# Patient Record
Sex: Female | Born: 2000 | Race: White | Hispanic: No | Marital: Single | Smoking: Never smoker
Health system: Southern US, Community
[De-identification: ages and names within clinical notes are randomized; demographics above are authoritative.]

## PROBLEM LIST (undated history)

## (undated) HISTORY — PX: NO PAST SURGERIES: SHX2092

---
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
## 2021-01-01 ENCOUNTER — Ambulatory Visit: Payer: BC Managed Care – PPO | Admitting: Obstetrics and Gynecology

## 2021-01-05 ENCOUNTER — Other Ambulatory Visit: Payer: Self-pay | Admitting: Obstetrics and Gynecology

## 2021-01-05 NOTE — Telephone Encounter (Signed)
Medication refill request: OCP  Last AEX:  01-14-20 BS Next AEX: 02-05-21 Last MMG (if hormonal medication request): n/a Refill authorized: Today, please advise.   Medication pended for #28, 0RF. Please refill if appropriate.

## 2021-01-13 ENCOUNTER — Other Ambulatory Visit: Payer: Self-pay

## 2021-01-13 MED ORDER — JUNEL FE 24 1-20 MG-MCG(24) PO TABS: 1.0000 | ORAL_TABLET | Freq: Every day | ORAL | 1 refills | Status: DC

## 2021-01-13 NOTE — Telephone Encounter (Signed)
Patient needs a refill for her BC enough until she comes for her annual in august with TW.  (Dr. Edward Jolly did not have available appt prior to patient returning to school.)  Scheduled 03/11/21

## 2021-02-05 ENCOUNTER — Ambulatory Visit: Payer: BC Managed Care – PPO | Admitting: Obstetrics and Gynecology

## 2021-03-11 ENCOUNTER — Encounter: Payer: Self-pay | Admitting: Nurse Practitioner

## 2021-03-11 ENCOUNTER — Ambulatory Visit (INDEPENDENT_AMBULATORY_CARE_PROVIDER_SITE_OTHER): Payer: BC Managed Care – PPO | Admitting: Nurse Practitioner

## 2021-03-11 ENCOUNTER — Other Ambulatory Visit: Payer: Self-pay

## 2021-03-11 VITALS — BP 114/70 | Ht 65.0 in | Wt 133.0 lb

## 2021-03-11 DIAGNOSIS — Z3041 Encounter for surveillance of contraceptive pills: Secondary | ICD-10-CM | POA: Diagnosis not present

## 2021-03-11 DIAGNOSIS — Z113 Encounter for screening for infections with a predominantly sexual mode of transmission: Secondary | ICD-10-CM | POA: Diagnosis not present

## 2021-03-11 DIAGNOSIS — Z01419 Encounter for gynecological examination (general) (routine) without abnormal findings: Secondary | ICD-10-CM

## 2021-03-11 MED ORDER — JUNEL FE 24 1-20 MG-MCG(24) PO TABS: 1.0000 | ORAL_TABLET | Freq: Every day | ORAL | 3 refills | Status: DC

## 2021-03-11 NOTE — Progress Notes (Signed)
   Kara Robertson 10-09-2000 [id number]   History:  20 y.o. G0 presents for annual exam without GYN complaints. Monthly cycles on OCPs. Reports having Gardasil series. Sexually active with one partner, would like STD screening today.   Gynecologic History Patient's last menstrual period was 02/18/2021. Period Cycle (Days): 28 Period Duration (Days): 4 Period Pattern: Regular Menstrual Flow: Light Dysmenorrhea: (!) Mild Dysmenorrhea Symptoms: Cramping Contraception/Family planning: OCP (estrogen/progesterone) Sexually active: Yes  Health Maintenance Last Pap: Not indicated Last mammogram: Not indicated Last colonoscopy: Not indicated Last Dexa: Not indicated  Past medical history, past surgical history, family history and social history were all reviewed and documented in the EPIC chart. 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  ROS:  A ROS was performed and pertinent positives and negatives are included.  Exam:  Vitals:   03/11/21 0824  BP: 114/70  Weight: 133 lb (60.3 kg)  Height: 5\' 5"  (1.651 m)   Body mass index is 22.13 kg/m.  General appearance:  Normal Thyroid:  Symmetrical, normal in size, without palpable masses or nodularity. Respiratory  Auscultation:  Clear without wheezing or rhonchi Cardiovascular  Auscultation:  Regular rate, without rubs, murmurs or gallops  Edema/varicosities:  Not grossly evident Abdominal  Soft,nontender, without masses, guarding or rebound.  Liver/spleen:  No organomegaly noted  Hernia:  None appreciated  Skin  Inspection:  Grossly normal Breasts: Not indicated per guidelines Genitourinary   Inguinal/mons:  Normal without inguinal adenopathy  External genitalia:  Normal appearing vulva with no masses, tenderness, or lesions  BUS/Urethra/Skene's glands:  Normal  Vagina:  Normal appearing with normal color and discharge, no lesions  Cervix:  Normal appearing without discharge or lesions  Uterus:  Normal in size, shape and contour.   Midline and mobile, nontender  Adnexa/parametria:     Rt: Normal in size, without masses or tenderness.   Lt: Normal in size, without masses or tenderness.  Anus and perineum: Normal  Patient informed chaperone available to be present for breast and pelvic exam. Patient has requested no chaperone to be present. Patient has been advised what will be completed during breast and pelvic exam.   Assessment/Plan:  20 y.o. G0 for annual exam.   Well female exam with routine gynecological exam - Education provided on SBEs, importance of preventative screenings, current guidelines, high calcium diet, regular exercise, safe sex, and multivitamin daily.   Encounter for surveillance of contraceptive pills - Plan: JUNEL FE 24 1-20 MG-MCG(24) tablet daily. Taking as prescribed. Refill x 1 year provided.   Screen for STD (sexually transmitted disease) - Plan: C. trachomatis/N. gonorrhoeae RNA  Return in 1 year for annual.   12 DNP, 8:41 AM 03/11/2021

## 2021-03-12 LAB — C. TRACHOMATIS/N. GONORRHOEAE RNA
C. trachomatis RNA, TMA: NOT DETECTED
N. gonorrhoeae RNA, TMA: NOT DETECTED

## 2021-04-09 ENCOUNTER — Encounter: Payer: Self-pay | Admitting: Obstetrics and Gynecology

## 2022-02-10 ENCOUNTER — Encounter: Payer: Self-pay | Admitting: Nurse Practitioner

## 2022-02-10 ENCOUNTER — Other Ambulatory Visit: Payer: Self-pay | Admitting: Nurse Practitioner

## 2022-02-10 DIAGNOSIS — Z3041 Encounter for surveillance of contraceptive pills: Secondary | ICD-10-CM

## 2022-02-10 MED ORDER — JUNEL FE 24 1-20 MG-MCG(24) PO TABS: 1.0000 | ORAL_TABLET | Freq: Every day | ORAL | 0 refills | Status: DC

## 2022-02-10 NOTE — Telephone Encounter (Signed)
I entered the pharmacy per patient request.

## 2022-03-15 NOTE — Progress Notes (Unsigned)
   Kara Robertson 19-Jul-2001 [id number]   History:  21 y.o. G0 presents for annual exam without GYN complaints. Monthly cycles on OCPs. Reports having Gardasil series.   Gynecologic History Patient's last menstrual period was 02/16/2022. Period Cycle (Days): 28 Period Duration (Days): 3-4 Period Pattern: Regular Menstrual Flow: Light Menstrual Control: Tampon Menstrual Control Change Freq (Hours): 6 Dysmenorrhea: (!) Mild Dysmenorrhea Symptoms: Cramping, Headache Contraception/Family planning: OCP (estrogen/progesterone) Sexually active: Yes  Health Maintenance Last Pap: Not indicated Last mammogram: Not indicated Last colonoscopy: Not indicated Last Dexa: Not indicated  Past medical history, past surgical history, family history and social history were all reviewed and documented in the EPIC chart. 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  ROS:  A ROS was performed and pertinent positives and negatives are included.  Exam:  Vitals:   03/16/22 0820  BP: 126/74  Pulse: 88  SpO2: 100%  Weight: 140 lb (63.5 kg)  Height: 5\' 5"  (1.651 m)    Body mass index is 23.3 kg/m.  General appearance:  Normal Thyroid:  Symmetrical, normal in size, without palpable masses or nodularity. Respiratory  Auscultation:  Clear without wheezing or rhonchi Cardiovascular  Auscultation:  Regular rate, without rubs, murmurs or gallops  Edema/varicosities:  Not grossly evident Abdominal  Soft,nontender, without masses, guarding or rebound.  Liver/spleen:  No organomegaly noted  Hernia:  None appreciated  Skin  Inspection:  Grossly normal Breasts: Not indicated per guidelines Genitourinary   Inguinal/mons:  Normal without inguinal adenopathy  External genitalia:  Normal appearing vulva with no masses, tenderness, or lesions  BUS/Urethra/Skene's glands:  Normal  Vagina:  Normal appearing with normal color and discharge, no lesions  Cervix:  Normal appearing without discharge or lesions  Uterus:   Normal in size, shape and contour.  Midline and mobile, nontender  Adnexa/parametria:     Rt: Normal in size, without masses or tenderness.   Lt: Normal in size, without masses or tenderness.  Anus and perineum: Normal  Patient informed chaperone available to be present for breast and pelvic exam. Patient has requested no chaperone to be present. Patient has been advised what will be completed during breast and pelvic exam.   Assessment/Plan:  21 y.o. G0 for annual exam.   Well female exam with routine gynecological exam - Education provided on SBEs, importance of preventative screenings, current guidelines, high calcium diet, regular exercise, safe sex, and multivitamin daily.   Encounter for surveillance of contraceptive pills - Plan: JUNEL FE 24 1-20 MG-MCG(24) tablet daily. Taking as prescribed. Refill x 1 year provided.   Screen for STD (sexually transmitted disease) - Plan: C. trachomatis/N. gonorrhoeae RNA  Return in 1 year for annual.     26 DNP, 8:37 AM 03/16/2022

## 2022-03-16 ENCOUNTER — Encounter: Payer: Self-pay | Admitting: Nurse Practitioner

## 2022-03-16 ENCOUNTER — Ambulatory Visit (INDEPENDENT_AMBULATORY_CARE_PROVIDER_SITE_OTHER): Payer: BC Managed Care – PPO | Admitting: Nurse Practitioner

## 2022-03-16 VITALS — BP 126/74 | HR 88 | Ht 65.0 in | Wt 140.0 lb

## 2022-03-16 DIAGNOSIS — Z01419 Encounter for gynecological examination (general) (routine) without abnormal findings: Secondary | ICD-10-CM | POA: Diagnosis not present

## 2022-03-16 DIAGNOSIS — Z3041 Encounter for surveillance of contraceptive pills: Secondary | ICD-10-CM

## 2022-03-16 DIAGNOSIS — Z113 Encounter for screening for infections with a predominantly sexual mode of transmission: Secondary | ICD-10-CM

## 2022-03-16 MED ORDER — JUNEL FE 24 1-20 MG-MCG(24) PO TABS: 1.0000 | ORAL_TABLET | Freq: Every day | ORAL | 3 refills | Status: DC

## 2022-03-17 LAB — C. TRACHOMATIS/N. GONORRHOEAE RNA
C. trachomatis RNA, TMA: NOT DETECTED
N. gonorrhoeae RNA, TMA: NOT DETECTED

## 2022-08-26 ENCOUNTER — Other Ambulatory Visit: Payer: Self-pay | Admitting: Nurse Practitioner

## 2022-08-26 DIAGNOSIS — Z3041 Encounter for surveillance of contraceptive pills: Secondary | ICD-10-CM

## 2023-01-31 ENCOUNTER — Other Ambulatory Visit: Payer: Self-pay

## 2023-01-31 DIAGNOSIS — Z3041 Encounter for surveillance of contraceptive pills: Secondary | ICD-10-CM

## 2023-01-31 NOTE — Telephone Encounter (Signed)
Pt LVM stating she believes that she is going to need one more refill on rx prior to her scheduled appt.   Last AEX 03/16/2022--scheduled for 03/18/2023.

## 2023-02-01 MED ORDER — HAILEY 24 FE 1-20 MG-MCG(24) PO TABS: 1.0000 | ORAL_TABLET | Freq: Every day | ORAL | 0 refills | Status: DC

## 2023-02-01 NOTE — Telephone Encounter (Signed)
Pt notified and voiced understanding 

## 2023-03-18 ENCOUNTER — Encounter: Payer: Self-pay | Admitting: Radiology

## 2023-03-18 ENCOUNTER — Other Ambulatory Visit (HOSPITAL_COMMUNITY)
Admission: RE | Admit: 2023-03-18 | Discharge: 2023-03-18 | Disposition: A | Discharge status: Home / Self Care | Payer: BC Managed Care – PPO | Source: Ambulatory Visit | Attending: Nurse Practitioner | Admitting: Nurse Practitioner

## 2023-03-18 ENCOUNTER — Ambulatory Visit (INDEPENDENT_AMBULATORY_CARE_PROVIDER_SITE_OTHER): Payer: BC Managed Care – PPO | Admitting: Radiology

## 2023-03-18 VITALS — BP 112/76 | Ht 65.25 in | Wt 147.0 lb

## 2023-03-18 DIAGNOSIS — Z01419 Encounter for gynecological examination (general) (routine) without abnormal findings: Secondary | ICD-10-CM | POA: Diagnosis not present

## 2023-03-18 DIAGNOSIS — Z3041 Encounter for surveillance of contraceptive pills: Secondary | ICD-10-CM

## 2023-03-18 DIAGNOSIS — Z113 Encounter for screening for infections with a predominantly sexual mode of transmission: Secondary | ICD-10-CM

## 2023-03-18 MED ORDER — JUNEL FE 24 1-20 MG-MCG(24) PO TABS: 1.0000 | ORAL_TABLET | Freq: Every day | ORAL | 4 refills | Status: DC

## 2023-03-18 NOTE — Progress Notes (Signed)
   Kara Robertson 02/03/2001 [id number]   History:  22 y.o. G0 presents for annual exam. No gyn concerns, doing well on OCP, Junel fe24.Marland Kitchen  Gynecologic History Patient's last menstrual period was 02/17/2023 (exact date).   Contraception/Family planning: OCP (estrogen/progesterone) Sexually active: yes   Obstetric History OB History  Gravida Para Term Preterm AB Living  0 0 0 0 0 0  SAB IAB Ectopic Multiple Live Births  0 0 0 0 0     The following portions of the patient's history were reviewed and updated as appropriate: allergies, current medications, past family history, past medical history, past social history, past surgical history, and problem list.  Review of Systems Pertinent items noted in HPI and remainder of comprehensive ROS otherwise negative.   Past medical history, past surgical history, family history and social history were all reviewed and documented in the EPIC chart.   Exam:  Vitals:   03/18/23 0826  BP: 112/76  Weight: 147 lb (66.7 kg)  Height: 5' 5.25" (1.657 m)   Body mass index is 24.27 kg/m.  General appearance:  Normal Thyroid:  Symmetrical, normal in size, without palpable masses or nodularity. Respiratory  Auscultation:  Clear without wheezing or rhonchi Cardiovascular  Auscultation:  Regular rate, without rubs, murmurs or gallops  Edema/varicosities:  Not grossly evident Abdominal  Soft,nontender, without masses, guarding or rebound.  Liver/spleen:  No organomegaly noted  Hernia:  None appreciated  Skin  Inspection:  Grossly normal Breasts: Examined lying and sitting.   Right: Without masses, retractions, nipple discharge or axillary adenopathy.   Left: Without masses, retractions, nipple discharge or axillary adenopathy. Genitourinary   Inguinal/mons:  Normal without inguinal adenopathy  External genitalia:  Normal appearing vulva with no masses, tenderness, or lesions  BUS/Urethra/Skene's glands:  Normal without masses or  exudate  Vagina:  Normal appearing with normal color and discharge, no lesions  Cervix:  Normal appearing without discharge or lesions  Uterus:  Normal in size, shape and contour.  Mobile, nontender  Adnexa/parametria:     Rt: Normal in size, without masses or tenderness.   Lt: Normal in size, without masses or tenderness.  Anus and perineum: Normal   Raynelle Fanning, CMA present for exam  Assessment/Plan:    1. Well woman exam with routine gynecological exam - Cytology - PAP( [place])  2. Screening for STDs (sexually transmitted diseases) - Cytology - PAP( [place])  3. Encounter for surveillance of contraceptive pills - Norethindrone Acetate-Ethinyl Estrad-FE (JUNEL FE 24) 1-20 MG-MCG(24) tablet; Take 1 tablet by mouth daily.  Dispense: 84 tablet; Refill: 4    Discussed SBE, pap and STI screening as directed/appropriate. Recommend of exercise weekly, including weight bearing exercise. Encouraged the use of seatbelts and sunscreen. Return in 1 year for annual or as needed.   [name] B WHNP-BC 8:28 AM 03/18/2023

## 2023-03-21 ENCOUNTER — Ambulatory Visit: Payer: BC Managed Care – PPO | Admitting: Nurse Practitioner

## 2023-03-21 LAB — CYTOLOGY - PAP
Chlamydia: NEGATIVE
Comment: NEGATIVE
Comment: NEGATIVE
Comment: NORMAL
Diagnosis: NEGATIVE
Neisseria Gonorrhea: NEGATIVE
Trichomonas: NEGATIVE

## 2024-03-22 ENCOUNTER — Ambulatory Visit: Payer: BC Managed Care – PPO | Admitting: Radiology

## 2024-03-29 ENCOUNTER — Other Ambulatory Visit: Payer: Self-pay

## 2024-03-29 DIAGNOSIS — Z3041 Encounter for surveillance of contraceptive pills: Secondary | ICD-10-CM

## 2024-03-29 MED ORDER — JUNEL FE 24 1-20 MG-MCG(24) PO TABS: 1.0000 | ORAL_TABLET | Freq: Every day | ORAL | 0 refills | Status: DC

## 2024-03-29 NOTE — Telephone Encounter (Signed)
 Med refill request: Junel  FE 24 - #84 tablets with 0 refills  Last AEX: 03/18/23  Next AEX: Pt just moved to [place]  and is currently looking for a new provider. Pt is kindly requesting one refill, just until she establishes with a new provider.   Please send to: [facility], [place].     Last MMG (if hormonal med) Refill authorized: Please Advise?

## 2024-06-13 ENCOUNTER — Telehealth: Payer: Self-pay

## 2024-06-13 DIAGNOSIS — Z3041 Encounter for surveillance of contraceptive pills: Secondary | ICD-10-CM

## 2024-06-13 MED ORDER — JUNEL FE 24 1-20 MG-MCG(24) PO TABS: 1.0000 | ORAL_TABLET | Freq: Every day | ORAL | 0 refills | Status: AC

## 2024-06-13 NOTE — Telephone Encounter (Signed)
 Medication refill request: junel  fe 24 Last AEX:  03-18-23 Next AEX: 08-29-24 Last MMG (if hormonal medication request): none Refill authorized: rx sent to pharmacy to get pt to her aex

## 2024-08-29 ENCOUNTER — Ambulatory Visit: Admitting: Radiology
